# Patient Record
Sex: Female | Born: 1940 | Race: Asian | Hispanic: No | Marital: Married | Smoking: Never smoker
Health system: Southern US, Community
[De-identification: ages and names within clinical notes are randomized; demographics above are authoritative.]

## PROBLEM LIST (undated history)

## (undated) DIAGNOSIS — E785 Hyperlipidemia, unspecified: Secondary | ICD-10-CM

## (undated) DIAGNOSIS — M810 Age-related osteoporosis without current pathological fracture: Secondary | ICD-10-CM

## (undated) HISTORY — DX: Hyperlipidemia, unspecified: E78.5

## (undated) HISTORY — DX: Age-related osteoporosis without current pathological fracture: M81.0

---

## 1998-07-07 ENCOUNTER — Other Ambulatory Visit: Admission: RE | Admit: 1998-07-07 | Discharge: 1998-07-07 | Payer: Self-pay | Admitting: Family Medicine

## 1999-07-19 ENCOUNTER — Other Ambulatory Visit: Admission: RE | Admit: 1999-07-19 | Discharge: 1999-07-19 | Payer: Self-pay | Admitting: Family Medicine

## 2003-12-24 ENCOUNTER — Other Ambulatory Visit: Admission: RE | Admit: 2003-12-24 | Discharge: 2003-12-24 | Payer: Self-pay | Admitting: Family Medicine

## 2004-03-24 ENCOUNTER — Ambulatory Visit: Payer: Self-pay | Admitting: Family Medicine

## 2004-03-31 ENCOUNTER — Ambulatory Visit: Payer: Self-pay | Admitting: Family Medicine

## 2004-12-29 ENCOUNTER — Ambulatory Visit: Payer: Self-pay | Admitting: Family Medicine

## 2005-01-05 ENCOUNTER — Ambulatory Visit: Payer: Self-pay | Admitting: Family Medicine

## 2005-12-21 ENCOUNTER — Ambulatory Visit: Payer: Self-pay | Admitting: Family Medicine

## 2005-12-28 ENCOUNTER — Other Ambulatory Visit: Admission: RE | Admit: 2005-12-28 | Discharge: 2005-12-28 | Payer: Self-pay | Admitting: Family Medicine

## 2005-12-28 ENCOUNTER — Ambulatory Visit: Payer: Self-pay | Admitting: Family Medicine

## 2006-01-27 ENCOUNTER — Ambulatory Visit: Payer: Self-pay | Admitting: Family Medicine

## 2006-02-21 ENCOUNTER — Ambulatory Visit: Payer: Self-pay | Admitting: Family Medicine

## 2006-12-27 ENCOUNTER — Encounter: Payer: Self-pay | Admitting: Family Medicine

## 2007-01-03 ENCOUNTER — Ambulatory Visit: Payer: Self-pay | Admitting: Family Medicine

## 2007-01-03 LAB — CONVERTED CEMR LAB
Bilirubin Urine: NEGATIVE
Ketones, urine, test strip: NEGATIVE
Specific Gravity, Urine: 1.02
Urobilinogen, UA: 0.2
pH: 6.5

## 2007-01-09 ENCOUNTER — Ambulatory Visit: Payer: Self-pay | Admitting: Family Medicine

## 2007-01-09 DIAGNOSIS — M19049 Primary osteoarthritis, unspecified hand: Secondary | ICD-10-CM | POA: Insufficient documentation

## 2007-01-09 DIAGNOSIS — L259 Unspecified contact dermatitis, unspecified cause: Secondary | ICD-10-CM | POA: Insufficient documentation

## 2007-01-09 LAB — CONVERTED CEMR LAB
Albumin: 4.2 g/dL (ref 3.5–5.2)
Alkaline Phosphatase: 52 units/L (ref 39–117)
BUN: 13 mg/dL (ref 6–23)
Basophils Absolute: 0 10*3/uL (ref 0.0–0.1)
Cholesterol: 151 mg/dL (ref 0–200)
GFR calc Af Amer: 93 mL/min
HDL: 65.6 mg/dL (ref >39.0)
Hemoglobin: 14.4 g/dL (ref 12.0–15.0)
Lymphocytes Relative: 41 % (ref 12.0–46.0)
MCHC: 34.4 g/dL (ref 30.0–36.0)
MCV: 94.1 fL (ref 78.0–100.0)
Monocytes Absolute: 0.6 10*3/uL (ref 0.2–0.7)
Monocytes Relative: 9 % (ref 3.0–11.0)
Neutro Abs: 2.8 10*3/uL (ref 1.4–7.7)
Potassium: 4.6 meq/L (ref 3.5–5.1)
Sodium: 146 meq/L — ABNORMAL HIGH (ref 135–145)
TSH: 1.11 microintl units/mL (ref 0.35–5.50)
Total Protein: 7.2 g/dL (ref 6.0–8.3)
Vit D, 1,25-Dihydroxy: 27 (ref 20–57)

## 2007-01-17 ENCOUNTER — Encounter: Payer: Self-pay | Admitting: Family Medicine

## 2007-01-24 ENCOUNTER — Ambulatory Visit: Payer: Self-pay | Admitting: Family Medicine

## 2007-01-25 ENCOUNTER — Encounter: Payer: Self-pay | Admitting: Family Medicine

## 2007-01-31 ENCOUNTER — Telehealth: Payer: Self-pay | Admitting: Family Medicine

## 2007-03-14 ENCOUNTER — Encounter: Payer: Self-pay | Admitting: Family Medicine

## 2007-12-18 ENCOUNTER — Ambulatory Visit: Payer: Self-pay | Admitting: Gastroenterology

## 2007-12-28 ENCOUNTER — Encounter: Payer: Self-pay | Admitting: Family Medicine

## 2007-12-31 ENCOUNTER — Ambulatory Visit: Payer: Self-pay | Admitting: Gastroenterology

## 2007-12-31 LAB — HM COLONOSCOPY

## 2008-01-08 ENCOUNTER — Ambulatory Visit: Payer: Self-pay | Admitting: Family Medicine

## 2008-01-08 LAB — CONVERTED CEMR LAB
Blood in Urine, dipstick: NEGATIVE
Glucose, Urine, Semiquant: NEGATIVE
Specific Gravity, Urine: 1.015
WBC Urine, dipstick: NEGATIVE
pH: 6

## 2008-01-15 ENCOUNTER — Other Ambulatory Visit: Admission: RE | Admit: 2008-01-15 | Discharge: 2008-01-15 | Payer: Self-pay | Admitting: Family Medicine

## 2008-01-15 ENCOUNTER — Encounter: Payer: Self-pay | Admitting: Family Medicine

## 2008-01-15 ENCOUNTER — Ambulatory Visit: Payer: Self-pay | Admitting: Family Medicine

## 2008-01-15 DIAGNOSIS — G4700 Insomnia, unspecified: Secondary | ICD-10-CM

## 2008-01-15 DIAGNOSIS — E785 Hyperlipidemia, unspecified: Secondary | ICD-10-CM

## 2008-01-15 DIAGNOSIS — M899 Disorder of bone, unspecified: Secondary | ICD-10-CM | POA: Insufficient documentation

## 2008-01-15 DIAGNOSIS — M949 Disorder of cartilage, unspecified: Secondary | ICD-10-CM

## 2008-01-15 LAB — CONVERTED CEMR LAB
Albumin: 4.1 g/dL (ref 3.5–5.2)
BUN: 15 mg/dL (ref 6–23)
Basophils Relative: 0.2 % (ref 0.0–3.0)
Calcium: 9.3 mg/dL (ref 8.4–10.5)
Creatinine, Ser: 0.8 mg/dL (ref 0.4–1.2)
Eosinophils Absolute: 0.2 10*3/uL (ref 0.0–0.7)
Eosinophils Relative: 3.7 % (ref 0.0–5.0)
GFR calc Af Amer: 92 mL/min
GFR calc non Af Amer: 76 mL/min
Glucose, Bld: 106 mg/dL — ABNORMAL HIGH (ref 70–99)
HCT: 40.5 % (ref 36.0–46.0)
HDL: 57.3 mg/dL (ref >39.0)
Hemoglobin: 14.5 g/dL (ref 12.0–15.0)
MCV: 94 fL (ref 78.0–100.0)
Monocytes Absolute: 0.4 10*3/uL (ref 0.1–1.0)
Monocytes Relative: 6.5 % (ref 3.0–12.0)
Neutro Abs: 3.5 10*3/uL (ref 1.4–7.7)
Platelets: 164 10*3/uL (ref 150–400)
RBC: 4.31 M/uL (ref 3.87–5.11)
TSH: 1.04 microintl units/mL (ref 0.35–5.50)
Total Protein: 7 g/dL (ref 6.0–8.3)
WBC: 6.6 10*3/uL (ref 4.5–10.5)

## 2008-01-22 ENCOUNTER — Encounter: Payer: Self-pay | Admitting: Family Medicine

## 2009-01-05 ENCOUNTER — Encounter: Payer: Self-pay | Admitting: Family Medicine

## 2009-01-12 ENCOUNTER — Telehealth: Payer: Self-pay | Admitting: Family Medicine

## 2009-01-12 DIAGNOSIS — R109 Unspecified abdominal pain: Secondary | ICD-10-CM | POA: Insufficient documentation

## 2009-01-21 ENCOUNTER — Ambulatory Visit: Payer: Self-pay | Admitting: Family Medicine

## 2009-01-21 LAB — CONVERTED CEMR LAB
Bilirubin Urine: NEGATIVE
Glucose, Urine, Semiquant: NEGATIVE
Protein, U semiquant: NEGATIVE
Specific Gravity, Urine: 1.02
WBC Urine, dipstick: NEGATIVE

## 2009-01-23 ENCOUNTER — Encounter: Payer: Self-pay | Admitting: Family Medicine

## 2009-01-28 ENCOUNTER — Ambulatory Visit: Payer: Self-pay | Admitting: Family Medicine

## 2009-01-28 DIAGNOSIS — I868 Varicose veins of other specified sites: Secondary | ICD-10-CM

## 2009-02-03 LAB — CONVERTED CEMR LAB
Alkaline Phosphatase: 52 units/L (ref 39–117)
BUN: 11 mg/dL (ref 6–23)
Basophils Absolute: 0 10*3/uL (ref 0.0–0.1)
Bilirubin, Direct: 0.1 mg/dL (ref 0.0–0.3)
Chloride: 103 meq/L (ref 96–112)
Cholesterol: 137 mg/dL (ref 0–200)
Eosinophils Absolute: 0.1 10*3/uL (ref 0.0–0.7)
GFR calc non Af Amer: 88.69 mL/min (ref >60)
HCT: 42.2 % (ref 36.0–46.0)
Hemoglobin: 14.3 g/dL (ref 12.0–15.0)
LDL Cholesterol: 68 mg/dL (ref 0–99)
Lymphocytes Relative: 36 % (ref 12.0–46.0)
MCHC: 33.9 g/dL (ref 30.0–36.0)
MCV: 96.2 fL (ref 78.0–100.0)
Neutrophils Relative %: 54.1 % (ref 43.0–77.0)
Platelets: 150 10*3/uL (ref 150.0–400.0)
Potassium: 4.5 meq/L (ref 3.5–5.1)
RDW: 12.1 % (ref 11.5–14.6)
Sodium: 142 meq/L (ref 135–145)
Total Bilirubin: 1.2 mg/dL (ref 0.3–1.2)
VLDL: 11.6 mg/dL (ref 0.0–40.0)
WBC: 5.4 10*3/uL (ref 4.5–10.5)

## 2009-02-19 ENCOUNTER — Ambulatory Visit (HOSPITAL_COMMUNITY): Admission: RE | Admit: 2009-02-19 | Discharge: 2009-02-19 | Payer: Self-pay | Admitting: Gastroenterology

## 2009-02-19 ENCOUNTER — Encounter (INDEPENDENT_AMBULATORY_CARE_PROVIDER_SITE_OTHER): Payer: Self-pay | Admitting: Gastroenterology

## 2011-04-19 DIAGNOSIS — G2581 Restless legs syndrome: Secondary | ICD-10-CM | POA: Diagnosis not present

## 2011-04-19 DIAGNOSIS — G4700 Insomnia, unspecified: Secondary | ICD-10-CM | POA: Diagnosis not present

## 2011-04-19 DIAGNOSIS — D518 Other vitamin B12 deficiency anemias: Secondary | ICD-10-CM | POA: Diagnosis not present

## 2011-04-25 DIAGNOSIS — H5210 Myopia, unspecified eye: Secondary | ICD-10-CM | POA: Diagnosis not present

## 2011-04-25 DIAGNOSIS — H35369 Drusen (degenerative) of macula, unspecified eye: Secondary | ICD-10-CM | POA: Diagnosis not present

## 2011-04-25 DIAGNOSIS — H4011X0 Primary open-angle glaucoma, stage unspecified: Secondary | ICD-10-CM | POA: Diagnosis not present

## 2011-04-25 DIAGNOSIS — H524 Presbyopia: Secondary | ICD-10-CM | POA: Diagnosis not present

## 2011-05-19 DIAGNOSIS — L259 Unspecified contact dermatitis, unspecified cause: Secondary | ICD-10-CM | POA: Diagnosis not present

## 2011-06-30 DIAGNOSIS — L259 Unspecified contact dermatitis, unspecified cause: Secondary | ICD-10-CM | POA: Diagnosis not present

## 2011-07-26 DIAGNOSIS — H40009 Preglaucoma, unspecified, unspecified eye: Secondary | ICD-10-CM | POA: Diagnosis not present

## 2011-09-09 DIAGNOSIS — G2581 Restless legs syndrome: Secondary | ICD-10-CM | POA: Diagnosis not present

## 2011-09-09 DIAGNOSIS — G4700 Insomnia, unspecified: Secondary | ICD-10-CM | POA: Diagnosis not present

## 2011-12-31 ENCOUNTER — Ambulatory Visit (INDEPENDENT_AMBULATORY_CARE_PROVIDER_SITE_OTHER): Payer: Medicare Other | Admitting: Family Medicine

## 2011-12-31 VITALS — BP 120/82 | HR 86 | Temp 98.5°F | Resp 16 | Ht 59.5 in | Wt 111.0 lb

## 2011-12-31 DIAGNOSIS — R059 Cough, unspecified: Secondary | ICD-10-CM | POA: Diagnosis not present

## 2011-12-31 DIAGNOSIS — J029 Acute pharyngitis, unspecified: Secondary | ICD-10-CM

## 2011-12-31 DIAGNOSIS — R05 Cough: Secondary | ICD-10-CM | POA: Diagnosis not present

## 2011-12-31 DIAGNOSIS — R070 Pain in throat: Secondary | ICD-10-CM

## 2011-12-31 MED ORDER — HYDROCODONE-HOMATROPINE 5-1.5 MG/5ML PO SYRP: 5.0000 mL | ORAL_SOLUTION | Freq: Three times a day (TID) | ORAL | Status: DC | PRN

## 2011-12-31 MED ORDER — AZITHROMYCIN 250 MG PO TABS: ORAL_TABLET | ORAL | Status: DC

## 2011-12-31 NOTE — Patient Instructions (Signed)

## 2011-12-31 NOTE — Progress Notes (Signed)
@[email or web address] @   Patient ID: Kelsey Brown MRN: [id number], DOB: December 09, 1940, 71 y.o. Date of Encounter: 12/31/2011, 11:08 AM  Primary Physician: Tally Due, MD  Chief Complaint:  Chief Complaint  Patient presents with  . Cough  . Nasal Congestion    HPI: 71 y.o. year old female presents with day history of sore throat. Subjective fever and chills. No cough, congestion, rhinorrhea, sinus pressure, otalgia, or headache. Normal hearing. No GI complaints. Able to swallow saliva, but hurts to do so. Decreased appetite secondary to sore throat.   No past medical history on file.   Home Meds: Prior to Admission medications   Medication Sig Start Date End Date Taking? Authorizing Provider  atorvastatin (LIPITOR) 20 MG tablet Take 20 mg by mouth daily.   Yes Historical Provider, MD  raloxifene (EVISTA) 60 MG tablet Take 60 mg by mouth daily.   Yes Historical Provider, MD  azithromycin (ZITHROMAX Z-PAK) 250 MG tablet Take as directed on pack 12/31/11   Elvina Sidle, MD  HYDROcodone-homatropine [facility]) 5-1.5 MG/5ML syrup Take 5 mLs by mouth every 8 (eight) hours as needed for cough. 12/31/11   Elvina Sidle, MD    Allergies: No Known Allergies  History   Social History  . Marital Status: Married    Spouse Name: N/A    Number of Children: N/A  . Years of Education: N/A   Occupational History  . Not on file.   Social History Main Topics  . Smoking status: Never Smoker   . Smokeless tobacco: Not on file  . Alcohol Use: Not on file  . Drug Use: Not on file  . Sexually Active: Not on file   Other Topics Concern  . Not on file   Social History Narrative  . No narrative on file     Review of Systems: Constitutional: negative for chills, fever, night sweats or weight changes HEENT: see above Cardiovascular: negative for chest pain or palpitations Respiratory: negative for hemoptysis, wheezing, or shortness of breath Abdominal: negative for abdominal pain, nausea,  vomiting or diarrhea Dermatological: negative for rash Neurologic: negative for headache   Physical Exam: Blood pressure 120/82, pulse 86, temperature 98.5 F (36.9 C), temperature source Oral, resp. rate 16, height 4' 11.5" (1.511 m), weight 111 lb (50.349 kg), SpO2 99.00%., Body mass index is 22.04 kg/(m^2). General: Well developed, well nourished, in no acute distress. Head: Normocephalic, atraumatic, eyes without discharge, sclera non-icteric, nares are patent. Bilateral auditory canals clear, TM's are without perforation, pearly grey with reflective cone of light bilaterally. No sinus TTP. Oral cavity moist, dentition normal. Posterior pharynx with post nasal drip and mild erythema. No peritonsillar abscess or tonsillar exudate. Neck: Supple. No thyromegaly. Full ROM. No lymphadenopathy. Lungs: Clear bilaterally to auscultation without wheezes, rales, or rhonchi. Breathing is unlabored. Heart: RRR with S1 S2. No murmurs, rubs, or gallops appreciated. Abdomen: Soft, non-tender, non-distended with normoactive bowel sounds. No hepatomegaly. No rebound/guarding. No obvious abdominal masses. Msk:  Strength and tone normal for age. Extremities: No clubbing or cyanosis. No edema. Neuro: Alert and oriented X 3. Moves all extremities spontaneously. CNII-XII grossly in tact. Psych:  Responds to questions appropriately with a normal affect.   Labs:   ASSESSMENT AND PLAN:  71 y.o. year old female with  - 1. Sore throat  Culture, Group A Strep, azithromycin (ZITHROMAX Z-PAK) 250 MG tablet  2. Cough  azithromycin (ZITHROMAX Z-PAK) 250 MG tablet, HYDROcodone-homatropine (HYCODAN) 5-1.5 MG/5ML syrup    -Tylenol/Motrin prn -Rest/fluids -RTC precautions -RTC  3-5 days if no improvement  Signed, Elvina Sidle, MD 12/31/2011 11:08 AM

## 2012-01-02 LAB — CULTURE, GROUP A STREP: Organism ID, Bacteria: NORMAL

## 2012-01-23 DIAGNOSIS — M899 Disorder of bone, unspecified: Secondary | ICD-10-CM | POA: Diagnosis not present

## 2012-01-23 DIAGNOSIS — M949 Disorder of cartilage, unspecified: Secondary | ICD-10-CM | POA: Diagnosis not present

## 2012-01-23 DIAGNOSIS — Z1231 Encounter for screening mammogram for malignant neoplasm of breast: Secondary | ICD-10-CM | POA: Diagnosis not present

## 2012-01-27 DIAGNOSIS — M899 Disorder of bone, unspecified: Secondary | ICD-10-CM | POA: Diagnosis not present

## 2012-01-27 DIAGNOSIS — G2581 Restless legs syndrome: Secondary | ICD-10-CM | POA: Diagnosis not present

## 2012-01-27 DIAGNOSIS — Z23 Encounter for immunization: Secondary | ICD-10-CM | POA: Diagnosis not present

## 2012-01-27 DIAGNOSIS — M949 Disorder of cartilage, unspecified: Secondary | ICD-10-CM | POA: Diagnosis not present

## 2012-01-27 DIAGNOSIS — G4700 Insomnia, unspecified: Secondary | ICD-10-CM | POA: Diagnosis not present

## 2012-01-27 DIAGNOSIS — Z0000 Encounter for general adult medical examination without abnormal findings: Secondary | ICD-10-CM | POA: Diagnosis not present

## 2012-01-27 DIAGNOSIS — E7800 Pure hypercholesterolemia, unspecified: Secondary | ICD-10-CM | POA: Diagnosis not present

## 2012-02-02 DIAGNOSIS — H40009 Preglaucoma, unspecified, unspecified eye: Secondary | ICD-10-CM | POA: Diagnosis not present

## 2012-02-15 DIAGNOSIS — Z01419 Encounter for gynecological examination (general) (routine) without abnormal findings: Secondary | ICD-10-CM | POA: Diagnosis not present

## 2012-02-15 DIAGNOSIS — Z124 Encounter for screening for malignant neoplasm of cervix: Secondary | ICD-10-CM | POA: Diagnosis not present

## 2012-05-03 DIAGNOSIS — H40009 Preglaucoma, unspecified, unspecified eye: Secondary | ICD-10-CM | POA: Diagnosis not present

## 2012-05-03 DIAGNOSIS — H2510 Age-related nuclear cataract, unspecified eye: Secondary | ICD-10-CM | POA: Diagnosis not present

## 2012-08-13 DIAGNOSIS — H40009 Preglaucoma, unspecified, unspecified eye: Secondary | ICD-10-CM | POA: Diagnosis not present

## 2013-01-29 DIAGNOSIS — Z1231 Encounter for screening mammogram for malignant neoplasm of breast: Secondary | ICD-10-CM | POA: Diagnosis not present

## 2013-02-13 DIAGNOSIS — H40009 Preglaucoma, unspecified, unspecified eye: Secondary | ICD-10-CM | POA: Diagnosis not present

## 2013-02-18 DIAGNOSIS — M899 Disorder of bone, unspecified: Secondary | ICD-10-CM | POA: Diagnosis not present

## 2013-02-18 DIAGNOSIS — E7800 Pure hypercholesterolemia, unspecified: Secondary | ICD-10-CM | POA: Diagnosis not present

## 2013-02-18 DIAGNOSIS — G2581 Restless legs syndrome: Secondary | ICD-10-CM | POA: Diagnosis not present

## 2013-02-18 DIAGNOSIS — Z0000 Encounter for general adult medical examination without abnormal findings: Secondary | ICD-10-CM | POA: Diagnosis not present

## 2013-02-26 DIAGNOSIS — Z01419 Encounter for gynecological examination (general) (routine) without abnormal findings: Secondary | ICD-10-CM | POA: Diagnosis not present

## 2013-02-26 DIAGNOSIS — Z124 Encounter for screening for malignant neoplasm of cervix: Secondary | ICD-10-CM | POA: Diagnosis not present

## 2013-04-25 DIAGNOSIS — H2510 Age-related nuclear cataract, unspecified eye: Secondary | ICD-10-CM | POA: Diagnosis not present

## 2013-04-25 DIAGNOSIS — H40009 Preglaucoma, unspecified, unspecified eye: Secondary | ICD-10-CM | POA: Diagnosis not present

## 2013-06-11 ENCOUNTER — Ambulatory Visit (INDEPENDENT_AMBULATORY_CARE_PROVIDER_SITE_OTHER): Payer: Medicare Other | Admitting: Internal Medicine

## 2013-06-11 VITALS — BP 124/80 | HR 99 | Temp 98.0°F | Resp 18 | Ht 60.0 in | Wt 118.0 lb

## 2013-06-11 DIAGNOSIS — J209 Acute bronchitis, unspecified: Secondary | ICD-10-CM | POA: Diagnosis not present

## 2013-06-11 MED ORDER — HYDROCOD POLST-CHLORPHEN POLST 10-8 MG/5ML PO LQCR: 5.0000 mL | Freq: Two times a day (BID) | ORAL | Status: DC | PRN

## 2013-06-11 MED ORDER — AZITHROMYCIN 250 MG PO TABS: ORAL_TABLET | ORAL | Status: DC

## 2013-06-11 NOTE — Progress Notes (Signed)
   Subjective:    Patient ID: Kelsey Brown, female    DOB: 08/19/1940, 73 y.o.   MRN: [id number]  HPI 73 year old patient presents with runny nose, cough, chest congestion. Pt states the symptoms having been going on for 10 days. Pt states no fever, headache, and sinus pressure. The cough is productive with yellowish/clear phlegm. She has been taking alka-seltzer plus; says it has helped a little bit. Pt has not been around anyone that is sick.  No sob, cp.   Review of Systems neg    Objective:   Physical Exam  Constitutional: She is oriented to person, place, and time. She appears well-developed and well-nourished.  HENT:  Head: Normocephalic.  Right Ear: External ear normal.  Left Ear: External ear normal.  Nose: Mucosal edema, rhinorrhea and sinus tenderness present. No epistaxis. Right sinus exhibits no frontal sinus tenderness. Left sinus exhibits no maxillary sinus tenderness and no frontal sinus tenderness.  Eyes: Conjunctivae and EOM are normal. Pupils are equal, round, and reactive to light.  Neck: Normal range of motion.  Cardiovascular: Normal rate.   Pulmonary/Chest: Effort normal. Not tachypneic. She has no decreased breath sounds. She has no wheezes. She has rhonchi. She has no rales.  Musculoskeletal: Normal range of motion.  Neurological: She is alert and oriented to person, place, and time. She exhibits normal muscle tone. Coordination normal.  Psychiatric: She has a normal mood and affect. Her behavior is normal. Judgment and thought content normal.          Assessment & Plan:

## 2013-06-11 NOTE — Patient Instructions (Signed)

## 2013-10-23 DIAGNOSIS — H40009 Preglaucoma, unspecified, unspecified eye: Secondary | ICD-10-CM | POA: Diagnosis not present

## 2014-02-03 DIAGNOSIS — Z1231 Encounter for screening mammogram for malignant neoplasm of breast: Secondary | ICD-10-CM | POA: Diagnosis not present

## 2014-02-03 DIAGNOSIS — M8580 Other specified disorders of bone density and structure, unspecified site: Secondary | ICD-10-CM | POA: Diagnosis not present

## 2014-02-03 DIAGNOSIS — E559 Vitamin D deficiency, unspecified: Secondary | ICD-10-CM | POA: Diagnosis not present

## 2014-03-19 DIAGNOSIS — G2581 Restless legs syndrome: Secondary | ICD-10-CM | POA: Diagnosis not present

## 2014-03-19 DIAGNOSIS — Z0001 Encounter for general adult medical examination with abnormal findings: Secondary | ICD-10-CM | POA: Diagnosis not present

## 2014-03-19 DIAGNOSIS — E780 Pure hypercholesterolemia: Secondary | ICD-10-CM | POA: Diagnosis not present

## 2014-03-19 DIAGNOSIS — M8580 Other specified disorders of bone density and structure, unspecified site: Secondary | ICD-10-CM | POA: Diagnosis not present

## 2014-03-19 DIAGNOSIS — H409 Unspecified glaucoma: Secondary | ICD-10-CM | POA: Diagnosis not present

## 2014-03-19 DIAGNOSIS — Z23 Encounter for immunization: Secondary | ICD-10-CM | POA: Diagnosis not present

## 2014-04-28 DIAGNOSIS — H2513 Age-related nuclear cataract, bilateral: Secondary | ICD-10-CM | POA: Diagnosis not present

## 2014-04-28 DIAGNOSIS — H40013 Open angle with borderline findings, low risk, bilateral: Secondary | ICD-10-CM | POA: Diagnosis not present

## 2014-10-29 DIAGNOSIS — H40013 Open angle with borderline findings, low risk, bilateral: Secondary | ICD-10-CM | POA: Diagnosis not present

## 2014-10-31 ENCOUNTER — Ambulatory Visit (INDEPENDENT_AMBULATORY_CARE_PROVIDER_SITE_OTHER): Payer: Medicare Other | Admitting: Family Medicine

## 2014-10-31 ENCOUNTER — Ambulatory Visit (INDEPENDENT_AMBULATORY_CARE_PROVIDER_SITE_OTHER): Payer: Medicare Other

## 2014-10-31 VITALS — BP 142/70 | HR 110 | Temp 98.7°F | Resp 18 | Ht 59.0 in | Wt 116.0 lb

## 2014-10-31 DIAGNOSIS — M19041 Primary osteoarthritis, right hand: Secondary | ICD-10-CM

## 2014-10-31 DIAGNOSIS — M1811 Unilateral primary osteoarthritis of first carpometacarpal joint, right hand: Secondary | ICD-10-CM

## 2014-10-31 DIAGNOSIS — M79644 Pain in right finger(s): Secondary | ICD-10-CM

## 2014-10-31 MED ORDER — PREDNISONE 20 MG PO TABS: ORAL_TABLET | ORAL | Status: AC

## 2014-10-31 NOTE — Patient Instructions (Signed)
You have an acute arthritis that has, after spraying the thumb. This should get better in the next couple days with the medicine prescribed. Make sure you take the prednisone with food. He can stop using the splint after Sunday.
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
## 2015-01-07 ENCOUNTER — Telehealth: Payer: Self-pay | Admitting: Family Medicine

## 2015-01-07 NOTE — Telephone Encounter (Signed)
Tried to call the patient to set up an appointment to schedule a mammogram.

## 2015-01-28 ENCOUNTER — Telehealth: Payer: Self-pay | Admitting: Family Medicine

## 2015-01-28 ENCOUNTER — Encounter: Payer: Self-pay | Admitting: Gastroenterology

## 2015-01-28 NOTE — Telephone Encounter (Signed)
LEFT A MESSAGE FOR PATIENT TO RETURN CALL TO SCHEDULE AN APPOINTMENT FOR FALL, AND SCREENING FOR HIGH BLOOD PRESSURE.

## 2015-02-10 DIAGNOSIS — Z1231 Encounter for screening mammogram for malignant neoplasm of breast: Secondary | ICD-10-CM | POA: Diagnosis not present

## 2015-03-25 DIAGNOSIS — M8588 Other specified disorders of bone density and structure, other site: Secondary | ICD-10-CM | POA: Diagnosis not present

## 2015-03-25 DIAGNOSIS — E7800 Pure hypercholesterolemia, unspecified: Secondary | ICD-10-CM | POA: Diagnosis not present

## 2015-03-25 DIAGNOSIS — H409 Unspecified glaucoma: Secondary | ICD-10-CM | POA: Diagnosis not present

## 2015-03-25 DIAGNOSIS — Z0000 Encounter for general adult medical examination without abnormal findings: Secondary | ICD-10-CM | POA: Diagnosis not present

## 2015-03-25 DIAGNOSIS — G2581 Restless legs syndrome: Secondary | ICD-10-CM | POA: Diagnosis not present

## 2015-03-25 DIAGNOSIS — Z23 Encounter for immunization: Secondary | ICD-10-CM | POA: Diagnosis not present

## 2015-05-04 DIAGNOSIS — H2513 Age-related nuclear cataract, bilateral: Secondary | ICD-10-CM | POA: Diagnosis not present

## 2015-05-04 DIAGNOSIS — H43812 Vitreous degeneration, left eye: Secondary | ICD-10-CM | POA: Diagnosis not present

## 2015-05-04 DIAGNOSIS — H40013 Open angle with borderline findings, low risk, bilateral: Secondary | ICD-10-CM | POA: Diagnosis not present

## 2015-11-04 DIAGNOSIS — H43813 Vitreous degeneration, bilateral: Secondary | ICD-10-CM | POA: Diagnosis not present

## 2015-11-04 DIAGNOSIS — H40013 Open angle with borderline findings, low risk, bilateral: Secondary | ICD-10-CM | POA: Diagnosis not present

## 2015-11-30 DIAGNOSIS — H25813 Combined forms of age-related cataract, bilateral: Secondary | ICD-10-CM | POA: Diagnosis not present

## 2015-11-30 DIAGNOSIS — H47233 Glaucomatous optic atrophy, bilateral: Secondary | ICD-10-CM | POA: Diagnosis not present

## 2015-12-10 DIAGNOSIS — H43813 Vitreous degeneration, bilateral: Secondary | ICD-10-CM | POA: Diagnosis not present

## 2015-12-31 DIAGNOSIS — Z23 Encounter for immunization: Secondary | ICD-10-CM | POA: Diagnosis not present

## 2016-02-11 DIAGNOSIS — M8589 Other specified disorders of bone density and structure, multiple sites: Secondary | ICD-10-CM | POA: Diagnosis not present

## 2016-02-11 DIAGNOSIS — Z1231 Encounter for screening mammogram for malignant neoplasm of breast: Secondary | ICD-10-CM | POA: Diagnosis not present

## 2016-02-18 DIAGNOSIS — E7800 Pure hypercholesterolemia, unspecified: Secondary | ICD-10-CM | POA: Diagnosis not present

## 2016-02-18 DIAGNOSIS — Z1389 Encounter for screening for other disorder: Secondary | ICD-10-CM | POA: Diagnosis not present

## 2016-02-18 DIAGNOSIS — Z0000 Encounter for general adult medical examination without abnormal findings: Secondary | ICD-10-CM | POA: Diagnosis not present

## 2016-02-18 DIAGNOSIS — M8580 Other specified disorders of bone density and structure, unspecified site: Secondary | ICD-10-CM | POA: Diagnosis not present

## 2016-06-15 DIAGNOSIS — H40013 Open angle with borderline findings, low risk, bilateral: Secondary | ICD-10-CM | POA: Diagnosis not present

## 2016-06-16 DIAGNOSIS — H409 Unspecified glaucoma: Secondary | ICD-10-CM | POA: Diagnosis not present

## 2016-06-16 DIAGNOSIS — M8580 Other specified disorders of bone density and structure, unspecified site: Secondary | ICD-10-CM | POA: Diagnosis not present

## 2016-06-16 DIAGNOSIS — E7800 Pure hypercholesterolemia, unspecified: Secondary | ICD-10-CM | POA: Diagnosis not present

## 2016-06-16 DIAGNOSIS — Z0000 Encounter for general adult medical examination without abnormal findings: Secondary | ICD-10-CM | POA: Diagnosis not present

## 2016-06-16 DIAGNOSIS — G2581 Restless legs syndrome: Secondary | ICD-10-CM | POA: Diagnosis not present

## 2016-07-13 DIAGNOSIS — H40013 Open angle with borderline findings, low risk, bilateral: Secondary | ICD-10-CM | POA: Diagnosis not present

## 2016-07-13 DIAGNOSIS — H43813 Vitreous degeneration, bilateral: Secondary | ICD-10-CM | POA: Diagnosis not present

## 2016-07-13 DIAGNOSIS — H2513 Age-related nuclear cataract, bilateral: Secondary | ICD-10-CM | POA: Diagnosis not present

## 2016-12-05 DIAGNOSIS — H47233 Glaucomatous optic atrophy, bilateral: Secondary | ICD-10-CM | POA: Diagnosis not present

## 2016-12-05 DIAGNOSIS — H02831 Dermatochalasis of right upper eyelid: Secondary | ICD-10-CM | POA: Diagnosis not present

## 2016-12-05 DIAGNOSIS — H25813 Combined forms of age-related cataract, bilateral: Secondary | ICD-10-CM | POA: Diagnosis not present

## 2017-01-25 IMAGING — CR DG FINGER THUMB 2+V*R*
3 series · 3 of 3 positions shown · non-contrast
Comparison: None.

CLINICAL DATA: Right hand injury yesterday.

EXAM:
RIGHT THUMB 2+V

[PA]
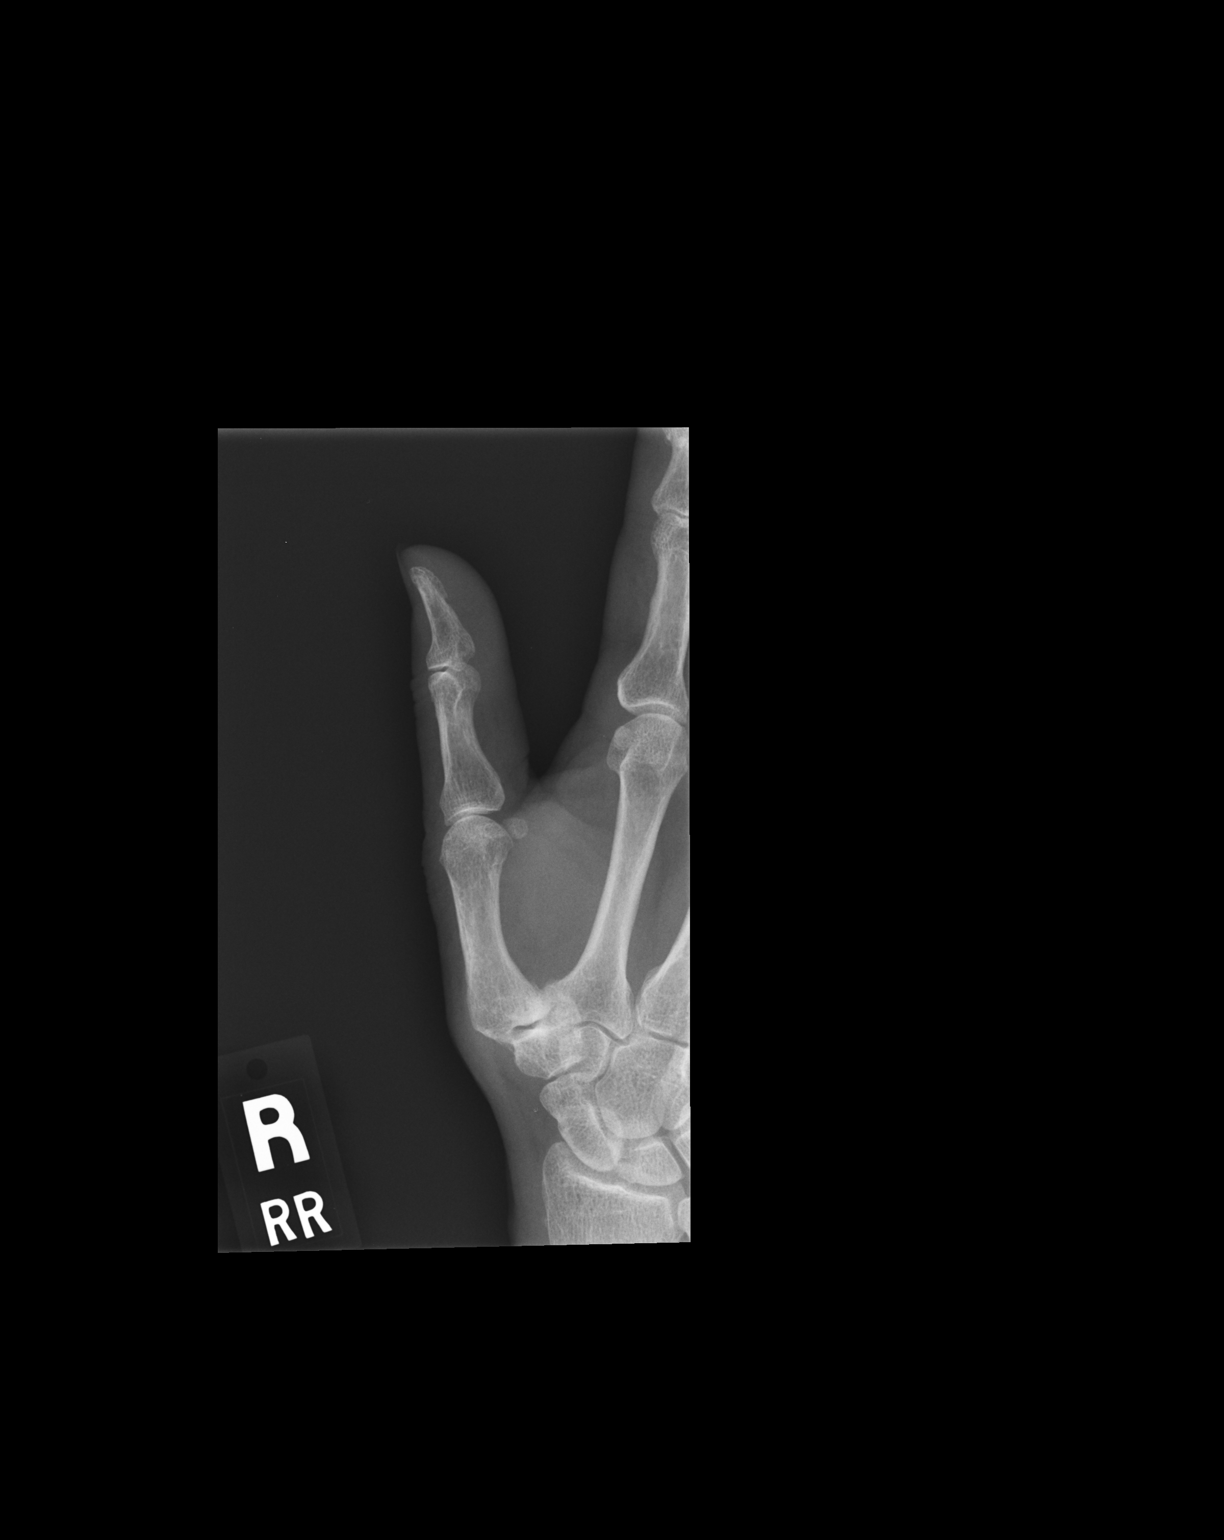

[pa obl]
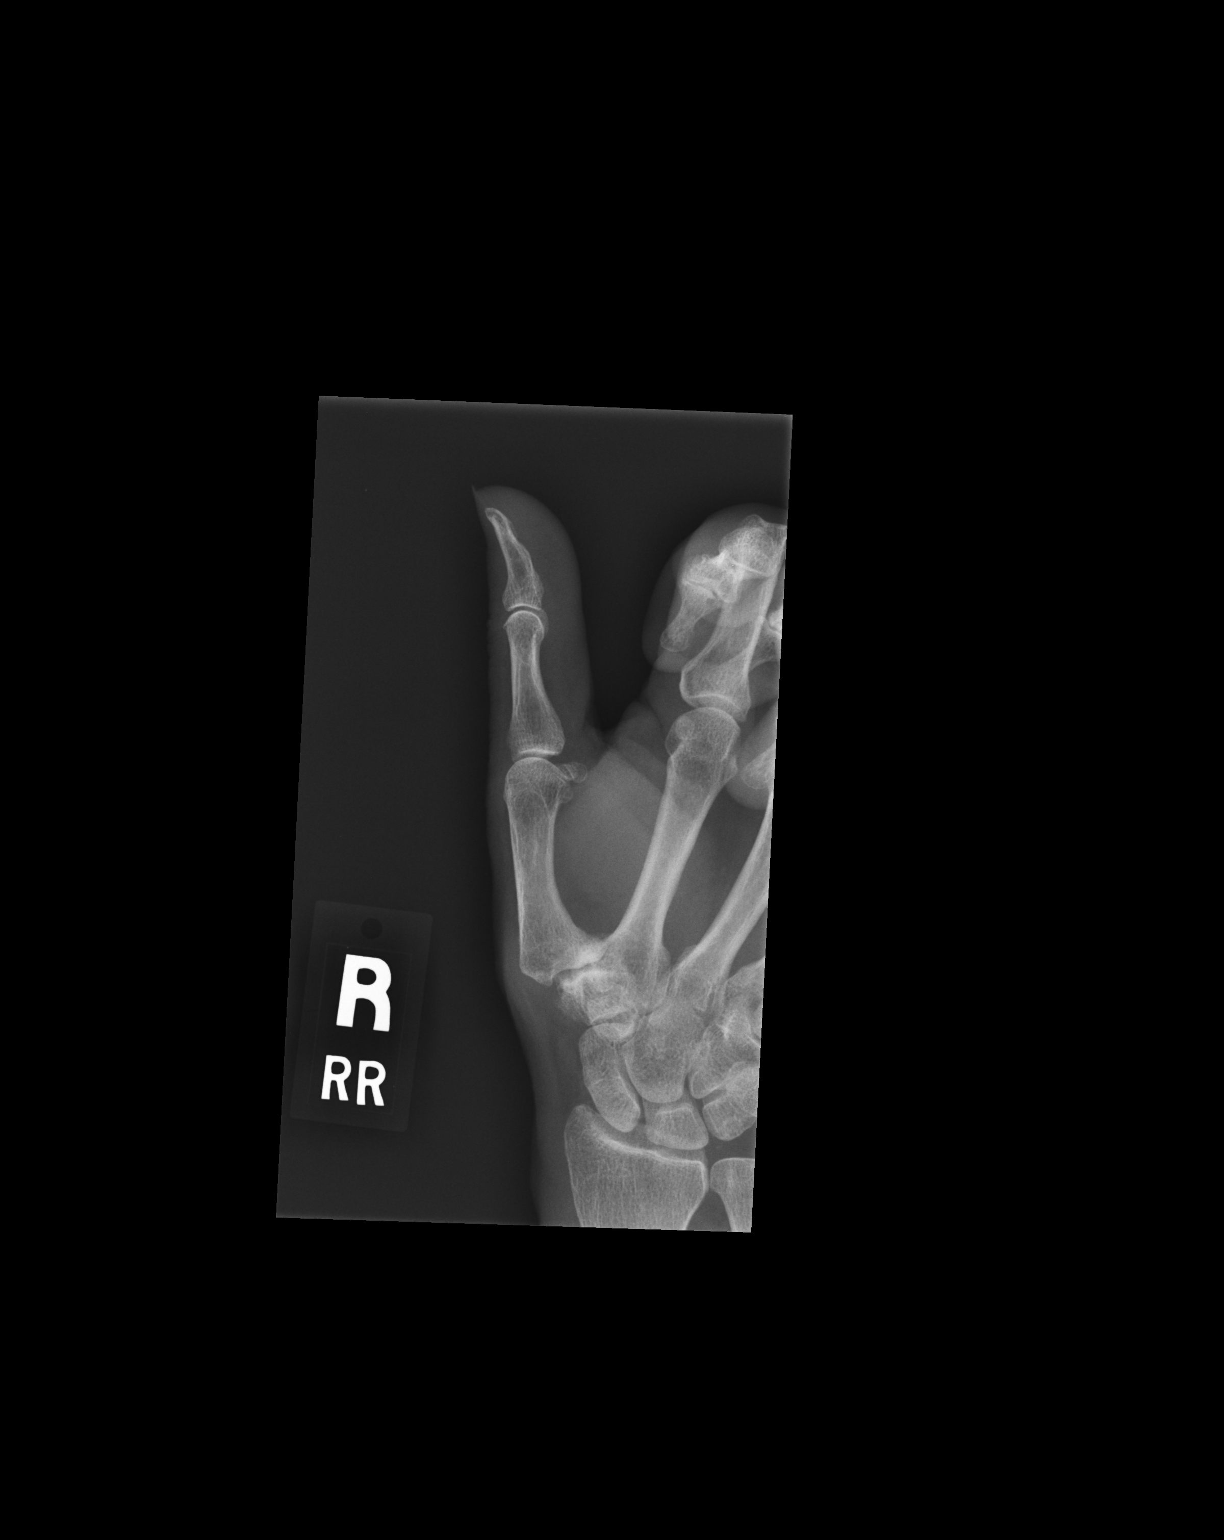

[lateral]
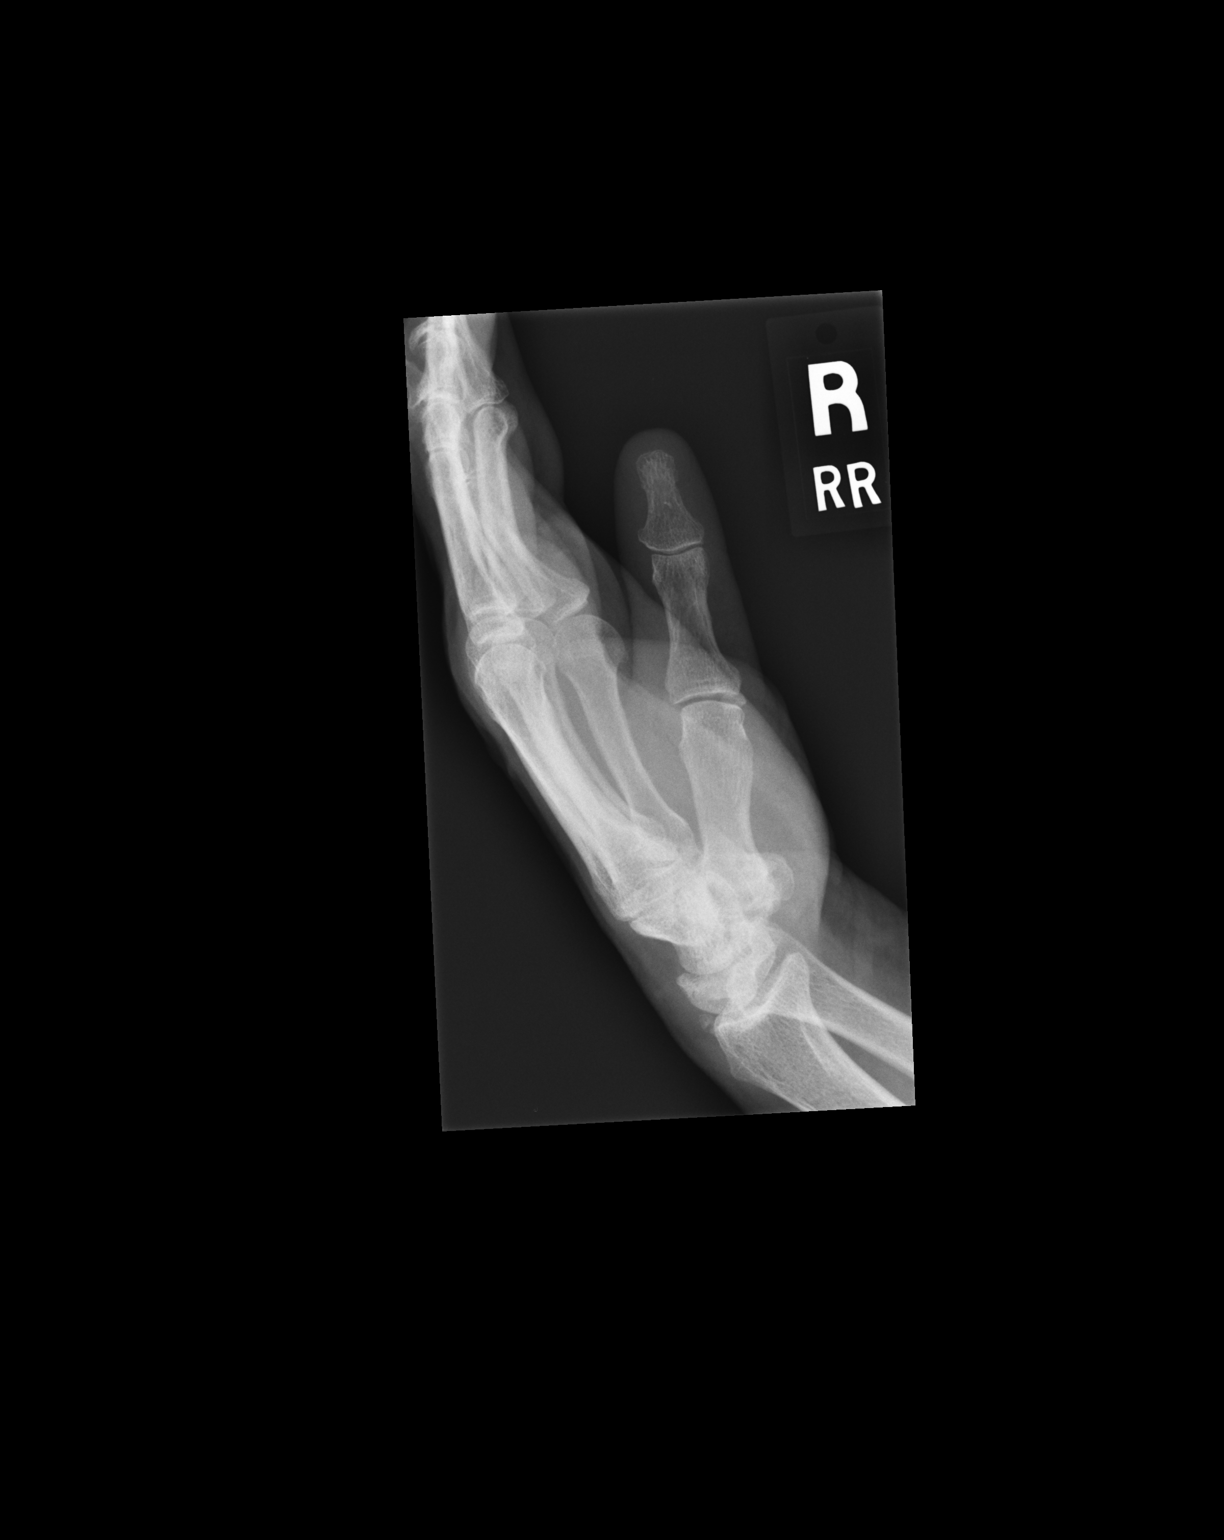

[3 of 3 positions shown; findings below may reference images not displayed]

FINDINGS: Degenerative changes are seen at the right first carpometacarpal
joint space, moderate in degree, with associated joint space
narrowing and articular surface sclerosis, and with associated mild
radial subluxation of the first metacarpal bone.

There is no fracture line or displaced fracture fragment identified.
No acute- appearing displacement. Soft tissues are unremarkable.
IMPRESSION: No acute fracture or dislocation.

Degenerative changes at the right first carpometacarpal joint space,
as detailed above.

## 2017-02-13 DIAGNOSIS — Z1231 Encounter for screening mammogram for malignant neoplasm of breast: Secondary | ICD-10-CM | POA: Diagnosis not present

## 2017-03-25 DIAGNOSIS — Z23 Encounter for immunization: Secondary | ICD-10-CM | POA: Diagnosis not present

## 2017-06-21 DIAGNOSIS — Z7721 Contact with and (suspected) exposure to potentially hazardous body fluids: Secondary | ICD-10-CM | POA: Diagnosis not present

## 2017-07-19 DIAGNOSIS — H2513 Age-related nuclear cataract, bilateral: Secondary | ICD-10-CM | POA: Diagnosis not present

## 2017-07-19 DIAGNOSIS — H43813 Vitreous degeneration, bilateral: Secondary | ICD-10-CM | POA: Diagnosis not present

## 2017-07-19 DIAGNOSIS — H40013 Open angle with borderline findings, low risk, bilateral: Secondary | ICD-10-CM | POA: Diagnosis not present

## 2017-11-06 DIAGNOSIS — Z79899 Other long term (current) drug therapy: Secondary | ICD-10-CM | POA: Diagnosis not present

## 2017-11-06 DIAGNOSIS — Z1211 Encounter for screening for malignant neoplasm of colon: Secondary | ICD-10-CM | POA: Diagnosis not present

## 2017-11-06 DIAGNOSIS — G2581 Restless legs syndrome: Secondary | ICD-10-CM | POA: Diagnosis not present

## 2017-11-06 DIAGNOSIS — G4700 Insomnia, unspecified: Secondary | ICD-10-CM | POA: Diagnosis not present

## 2017-11-06 DIAGNOSIS — Z23 Encounter for immunization: Secondary | ICD-10-CM | POA: Diagnosis not present

## 2017-11-06 DIAGNOSIS — M19041 Primary osteoarthritis, right hand: Secondary | ICD-10-CM | POA: Diagnosis not present

## 2017-11-06 DIAGNOSIS — Z0000 Encounter for general adult medical examination without abnormal findings: Secondary | ICD-10-CM | POA: Diagnosis not present

## 2017-11-06 DIAGNOSIS — Z1389 Encounter for screening for other disorder: Secondary | ICD-10-CM | POA: Diagnosis not present

## 2017-11-06 DIAGNOSIS — E7800 Pure hypercholesterolemia, unspecified: Secondary | ICD-10-CM | POA: Diagnosis not present

## 2017-11-06 DIAGNOSIS — M8580 Other specified disorders of bone density and structure, unspecified site: Secondary | ICD-10-CM | POA: Diagnosis not present

## 2017-12-19 DIAGNOSIS — H40003 Preglaucoma, unspecified, bilateral: Secondary | ICD-10-CM | POA: Diagnosis not present

## 2017-12-31 ENCOUNTER — Encounter (HOSPITAL_COMMUNITY): Payer: Self-pay | Admitting: Emergency Medicine

## 2017-12-31 ENCOUNTER — Emergency Department (HOSPITAL_COMMUNITY)
Admission: EM | Admit: 2017-12-31 | Discharge: 2017-12-31 | Disposition: A | Discharge status: Home / Self Care | Payer: Medicare Other | Attending: Emergency Medicine | Admitting: Emergency Medicine

## 2017-12-31 ENCOUNTER — Other Ambulatory Visit: Payer: Self-pay

## 2017-12-31 DIAGNOSIS — Z5321 Procedure and treatment not carried out due to patient leaving prior to being seen by health care provider: Secondary | ICD-10-CM | POA: Insufficient documentation

## 2017-12-31 DIAGNOSIS — R42 Dizziness and giddiness: Secondary | ICD-10-CM | POA: Diagnosis present

## 2017-12-31 NOTE — ED Notes (Signed)
Pt gave stickers to registration and is leaving

## 2017-12-31 NOTE — ED Triage Notes (Signed)
Pt with dizziness and vomiting since this morning. Pt denies fever.

## 2018-01-01 DIAGNOSIS — H8309 Labyrinthitis, unspecified ear: Secondary | ICD-10-CM | POA: Diagnosis not present

## 2018-01-01 DIAGNOSIS — B349 Viral infection, unspecified: Secondary | ICD-10-CM | POA: Diagnosis not present

## 2018-01-15 DIAGNOSIS — Z23 Encounter for immunization: Secondary | ICD-10-CM | POA: Diagnosis not present

## 2018-02-15 DIAGNOSIS — M8589 Other specified disorders of bone density and structure, multiple sites: Secondary | ICD-10-CM | POA: Diagnosis not present

## 2018-02-15 DIAGNOSIS — Z1231 Encounter for screening mammogram for malignant neoplasm of breast: Secondary | ICD-10-CM | POA: Diagnosis not present

## 2018-02-21 DIAGNOSIS — D123 Benign neoplasm of transverse colon: Secondary | ICD-10-CM | POA: Diagnosis not present

## 2018-02-21 DIAGNOSIS — Z1211 Encounter for screening for malignant neoplasm of colon: Secondary | ICD-10-CM | POA: Diagnosis not present

## 2018-02-21 DIAGNOSIS — D122 Benign neoplasm of ascending colon: Secondary | ICD-10-CM | POA: Diagnosis not present

## 2018-02-23 DIAGNOSIS — D123 Benign neoplasm of transverse colon: Secondary | ICD-10-CM | POA: Diagnosis not present

## 2018-02-23 DIAGNOSIS — D122 Benign neoplasm of ascending colon: Secondary | ICD-10-CM | POA: Diagnosis not present

## 2018-02-23 DIAGNOSIS — Z1211 Encounter for screening for malignant neoplasm of colon: Secondary | ICD-10-CM | POA: Diagnosis not present

## 2018-08-22 DIAGNOSIS — H40013 Open angle with borderline findings, low risk, bilateral: Secondary | ICD-10-CM | POA: Diagnosis not present

## 2018-08-22 DIAGNOSIS — H43813 Vitreous degeneration, bilateral: Secondary | ICD-10-CM | POA: Diagnosis not present

## 2018-08-22 DIAGNOSIS — H2513 Age-related nuclear cataract, bilateral: Secondary | ICD-10-CM | POA: Diagnosis not present

## 2018-12-05 DIAGNOSIS — M549 Dorsalgia, unspecified: Secondary | ICD-10-CM | POA: Diagnosis not present

## 2018-12-05 DIAGNOSIS — E559 Vitamin D deficiency, unspecified: Secondary | ICD-10-CM | POA: Diagnosis not present

## 2018-12-05 DIAGNOSIS — Z23 Encounter for immunization: Secondary | ICD-10-CM | POA: Diagnosis not present

## 2018-12-05 DIAGNOSIS — Z5181 Encounter for therapeutic drug level monitoring: Secondary | ICD-10-CM | POA: Diagnosis not present

## 2018-12-05 DIAGNOSIS — Z0000 Encounter for general adult medical examination without abnormal findings: Secondary | ICD-10-CM | POA: Diagnosis not present

## 2018-12-05 DIAGNOSIS — Z1389 Encounter for screening for other disorder: Secondary | ICD-10-CM | POA: Diagnosis not present

## 2018-12-05 DIAGNOSIS — Z1211 Encounter for screening for malignant neoplasm of colon: Secondary | ICD-10-CM | POA: Diagnosis not present

## 2018-12-05 DIAGNOSIS — M8580 Other specified disorders of bone density and structure, unspecified site: Secondary | ICD-10-CM | POA: Diagnosis not present

## 2018-12-05 DIAGNOSIS — G2581 Restless legs syndrome: Secondary | ICD-10-CM | POA: Diagnosis not present

## 2018-12-05 DIAGNOSIS — Z79899 Other long term (current) drug therapy: Secondary | ICD-10-CM | POA: Diagnosis not present

## 2018-12-05 DIAGNOSIS — G4700 Insomnia, unspecified: Secondary | ICD-10-CM | POA: Diagnosis not present

## 2018-12-05 DIAGNOSIS — M19041 Primary osteoarthritis, right hand: Secondary | ICD-10-CM | POA: Diagnosis not present

## 2018-12-05 DIAGNOSIS — E7800 Pure hypercholesterolemia, unspecified: Secondary | ICD-10-CM | POA: Diagnosis not present

## 2018-12-05 DIAGNOSIS — E785 Hyperlipidemia, unspecified: Secondary | ICD-10-CM | POA: Diagnosis not present

## 2019-01-02 DIAGNOSIS — Z23 Encounter for immunization: Secondary | ICD-10-CM | POA: Diagnosis not present

## 2019-02-20 DIAGNOSIS — Z1231 Encounter for screening mammogram for malignant neoplasm of breast: Secondary | ICD-10-CM | POA: Diagnosis not present

## 2019-03-07 ENCOUNTER — Emergency Department (HOSPITAL_COMMUNITY)
Admission: EM | Admit: 2019-03-07 | Discharge: 2019-03-07 | Disposition: A | Discharge status: Home / Self Care | Payer: Medicare Other | Attending: Emergency Medicine | Admitting: Emergency Medicine

## 2019-03-07 ENCOUNTER — Other Ambulatory Visit: Payer: Self-pay

## 2019-03-07 ENCOUNTER — Encounter (HOSPITAL_COMMUNITY): Payer: Self-pay | Admitting: Emergency Medicine

## 2019-03-07 DIAGNOSIS — S61011A Laceration without foreign body of right thumb without damage to nail, initial encounter: Secondary | ICD-10-CM | POA: Diagnosis not present

## 2019-03-07 DIAGNOSIS — Y92000 Kitchen of unspecified non-institutional (private) residence as  the place of occurrence of the external cause: Secondary | ICD-10-CM | POA: Diagnosis not present

## 2019-03-07 DIAGNOSIS — Y999 Unspecified external cause status: Secondary | ICD-10-CM | POA: Insufficient documentation

## 2019-03-07 DIAGNOSIS — Y280XXA Contact with sharp glass, undetermined intent, initial encounter: Secondary | ICD-10-CM | POA: Diagnosis not present

## 2019-03-07 DIAGNOSIS — Y93G1 Activity, food preparation and clean up: Secondary | ICD-10-CM | POA: Insufficient documentation

## 2019-03-07 DIAGNOSIS — Z79899 Other long term (current) drug therapy: Secondary | ICD-10-CM | POA: Insufficient documentation

## 2019-03-07 MED ORDER — LIDOCAINE HCL (PF) 1 % IJ SOLN
5.0000 mL | Freq: Once | INTRAMUSCULAR | Status: AC
  Administered 2019-03-07: 23:00:00 5 mL
  Filled 2019-03-07: qty 5

## 2019-03-07 NOTE — ED Provider Notes (Signed)
[facility] EMERGENCY DEPARTMENT Provider Note   CSN: [id number] Arrival date & time: 03/07/19  2016     History   Chief Complaint Chief Complaint  Patient presents with  . Hand Laceration    HPI Kelsey Brown is a 78 y.o. female.     Patient to ED after laceration injury to right thumb caused by broken glass while washing dishes this afternoon. No other injury. No numbness or uncontrolled bleeding. Patient is up-to-date on tetanus.  The history is provided by the patient. No language interpreter was used.    Past Medical History:  Diagnosis Date  . Hyperlipidemia   . Osteoporosis     Patient Active Problem List   Diagnosis Date Noted  . 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 ABDOMINAL PAIN 01/12/2009  . HYPERLIPIDEMIA 01/15/2008  . OSTEOPENIA 01/15/2008  . INSOMNIA 01/15/2008  . ECZEMA 01/09/2007  . ARTHRITIS, HANDS, BILATERAL 01/09/2007    History reviewed. No pertinent surgical history.   OB History   No obstetric history on file.      Home Medications    Prior to Admission medications   Medication Sig Start Date End Date Taking? Authorizing Provider  atorvastatin (LIPITOR) 20 MG tablet Take 20 mg by mouth daily.    [provider]  predniSONE (DELTASONE) 20 MG tablet Two daily with food 10/31/14   [name], MD  raloxifene (EVISTA) 60 MG tablet Take 60 mg by mouth daily.    [provider]    Family History Family History  Problem Relation Age of Onset  . Cancer Mother   . Diabetes Father     Social History Social History   Tobacco Use  . Smoking status: Never Smoker  . Smokeless tobacco: Never Used  Substance Use Topics  . Alcohol use: Never    Frequency: Never  . Drug use: Never     Allergies   Patient has no known allergies.   Review of Systems Review of Systems  Musculoskeletal:       See HPI.  Skin: Positive for wound.  Neurological: Negative for numbness.     Physical Exam Updated  Vital Signs BP (!) 169/91 (BP Location: Right Arm)   Pulse (!) 106   Temp 98.3 F (36.8 C) (Oral)   Resp 15   SpO2 99%   Physical Exam Constitutional:      Appearance: She is well-developed.  Neck:     Musculoskeletal: Normal range of motion.  Pulmonary:     Effort: Pulmonary effort is normal.  Skin:    General: Skin is warm and dry.     Comments: 2.5 cm linear laceration at medial base of right thumb. No exposed tendons. No FB visualized or palpated. FROM without tendon deficit.   Neurological:     Mental Status: She is alert and oriented to person, place, and time.     Sensory: No sensory deficit.      ED Treatments / Results  Labs (all labs ordered are listed, but only abnormal results are displayed) Labs Reviewed - No data to display  EKG None  Radiology No results found.  Procedures .Marland KitchenLaceration Repair  Date/Time: 03/07/2019 10:29 PM Performed by: Charlann Lange, PA-C Authorized by: Charlann Lange, PA-C   Consent:    Consent obtained:  Verbal   Consent given by:  Patient Anesthesia (see MAR for exact dosages):    Anesthesia method:  Local infiltration   Local anesthetic:  Lidocaine 1% w/o epi Laceration details:  Location:  Finger   Finger location:  R thumb   Length (cm):  2.5 Pre-procedure details:    Preparation:  Patient was prepped and draped in usual sterile fashion Exploration:    Hemostasis achieved with:  Direct pressure   Wound exploration: wound explored through full range of motion and entire depth of wound probed and visualized     Wound extent: no foreign bodies/material noted   Treatment:    Area cleansed with:  Betadine and saline Skin repair:    Repair method:  Sutures   Suture size:  5-0   Suture material:  Prolene   Suture technique:  Simple interrupted   Number of sutures:  4 Approximation:    Approximation:  Close Post-procedure details:    Dressing:  Antibiotic ointment and non-adherent dressing   Patient tolerance of  procedure:  Tolerated well, no immediate complications   (including critical care time)  Medications Ordered in ED Medications  lidocaine (PF) (XYLOCAINE) 1 % injection 5 mL (has no administration in time range)     Initial Impression / Assessment and Plan / ED Course  I have reviewed the triage vital signs and the nursing notes.  Pertinent labs & imaging results that were available during my care of the patient were reviewed by me and considered in my medical decision making (see chart for details).        Patient to ED with uncomplicated right thumb laceration, repaired as per above note.   Final Clinical Impressions(s) / ED Diagnoses   Final diagnoses:  None   1. Right thumb laceration  ED Discharge Orders    None       Dennie Bible 03/07/19 2251    Charlesetta Shanks, MD 03/08/19 Bosie Helper

## 2019-03-07 NOTE — ED Triage Notes (Signed)
Patient presents wit skin laceration at right palm sustained from a broken drinking glass while washing dishes this evening , minimal bleeding , japanese interpreter service utilized at triage .

## 2019-03-07 NOTE — ED Notes (Signed)
Patient verbalizes understanding of discharge instructions. Opportunity for questioning and answers were provided. Armband removed by staff, pt discharged from ED ambulatory.   

## 2019-03-07 NOTE — Discharge Instructions (Addendum)
Sutures need to be removed in 7 days with your primary medical provider, any Urgent Care or the Emergency Department. Go sooner for re-examination if there are any signs of infection - increasing pain or redness, drainage from the wound or fever. Tylenol as needed for discomfort.

## 2019-03-14 DIAGNOSIS — S61219A Laceration without foreign body of unspecified finger without damage to nail, initial encounter: Secondary | ICD-10-CM | POA: Diagnosis not present

## 2019-04-27 ENCOUNTER — Ambulatory Visit: Payer: Medicare Other | Attending: Internal Medicine

## 2019-04-27 DIAGNOSIS — Z23 Encounter for immunization: Secondary | ICD-10-CM | POA: Diagnosis not present

## 2019-04-27 NOTE — Progress Notes (Signed)
   Covid-19 Vaccination Clinic  Name:  Raeshawn Demetrius    MRN: [id number] DOB: 12-14-1940  04/27/2019  Ms. Letson was observed post Covid-19 immunization for 15 minutes without incidence. She was provided with Vaccine Information Sheet and instruction to access the V-Safe system.   Ms. Feilen was instructed to call 911 with any severe reactions post vaccine: Marland Kitchen Difficulty breathing  . Swelling of your face and throat  . A fast heartbeat  . A bad rash all over your body  . Dizziness and weakness    Immunizations Administered    Name Date Dose VIS Date Route   Pfizer COVID-19 Vaccine 04/27/2019  1:26 PM 0.3 mL 03/22/2019 Intramuscular   Manufacturer: [place]   Lot: [id number]   [place]: [id number]

## 2019-05-17 ENCOUNTER — Ambulatory Visit: Payer: Medicare Other | Attending: Internal Medicine

## 2019-05-17 DIAGNOSIS — Z23 Encounter for immunization: Secondary | ICD-10-CM | POA: Insufficient documentation

## 2019-05-17 NOTE — Progress Notes (Signed)
   Covid-19 Vaccination Clinic  Name:  Prescious Sucher    MRN: [id number] DOB: 21-Mar-1941  05/17/2019  Ms. Grierson was observed post Covid-19 immunization for 15 minutes without incidence. She was provided with Vaccine Information Sheet and instruction to access the V-Safe system.   Ms. Purington was instructed to call 911 with any severe reactions post vaccine: Marland Kitchen Difficulty breathing  . Swelling of your face and throat  . A fast heartbeat  . A bad rash all over your body  . Dizziness and weakness    Immunizations Administered    Name Date Dose VIS Date Route   Pfizer COVID-19 Vaccine 05/17/2019  8:52 AM 0.3 mL 03/22/2019 Intramuscular   Manufacturer: [place]   Lot: [id number]   [place]: [id number]

## 2019-08-28 DIAGNOSIS — H43813 Vitreous degeneration, bilateral: Secondary | ICD-10-CM | POA: Diagnosis not present

## 2019-08-28 DIAGNOSIS — H40013 Open angle with borderline findings, low risk, bilateral: Secondary | ICD-10-CM | POA: Diagnosis not present

## 2019-12-10 DIAGNOSIS — M19041 Primary osteoarthritis, right hand: Secondary | ICD-10-CM | POA: Diagnosis not present

## 2019-12-10 DIAGNOSIS — M189 Osteoarthritis of first carpometacarpal joint, unspecified: Secondary | ICD-10-CM | POA: Diagnosis not present

## 2019-12-10 DIAGNOSIS — G2581 Restless legs syndrome: Secondary | ICD-10-CM | POA: Diagnosis not present

## 2019-12-10 DIAGNOSIS — Z79899 Other long term (current) drug therapy: Secondary | ICD-10-CM | POA: Diagnosis not present

## 2019-12-10 DIAGNOSIS — E785 Hyperlipidemia, unspecified: Secondary | ICD-10-CM | POA: Diagnosis not present

## 2019-12-10 DIAGNOSIS — G4700 Insomnia, unspecified: Secondary | ICD-10-CM | POA: Diagnosis not present

## 2019-12-10 DIAGNOSIS — Z5181 Encounter for therapeutic drug level monitoring: Secondary | ICD-10-CM | POA: Diagnosis not present

## 2019-12-10 DIAGNOSIS — E559 Vitamin D deficiency, unspecified: Secondary | ICD-10-CM | POA: Diagnosis not present

## 2020-01-05 DIAGNOSIS — Z23 Encounter for immunization: Secondary | ICD-10-CM | POA: Diagnosis not present

## 2020-08-13 DIAGNOSIS — K529 Noninfective gastroenteritis and colitis, unspecified: Secondary | ICD-10-CM | POA: Diagnosis not present

## 2020-08-31 DIAGNOSIS — H2513 Age-related nuclear cataract, bilateral: Secondary | ICD-10-CM | POA: Diagnosis not present

## 2020-08-31 DIAGNOSIS — H40013 Open angle with borderline findings, low risk, bilateral: Secondary | ICD-10-CM | POA: Diagnosis not present

## 2020-08-31 DIAGNOSIS — H43813 Vitreous degeneration, bilateral: Secondary | ICD-10-CM | POA: Diagnosis not present

## 2020-08-31 DIAGNOSIS — H25043 Posterior subcapsular polar age-related cataract, bilateral: Secondary | ICD-10-CM | POA: Diagnosis not present

## 2020-12-16 DIAGNOSIS — H6120 Impacted cerumen, unspecified ear: Secondary | ICD-10-CM | POA: Diagnosis not present

## 2020-12-16 DIAGNOSIS — Z5181 Encounter for therapeutic drug level monitoring: Secondary | ICD-10-CM | POA: Diagnosis not present

## 2020-12-16 DIAGNOSIS — G2581 Restless legs syndrome: Secondary | ICD-10-CM | POA: Diagnosis not present

## 2020-12-16 DIAGNOSIS — E785 Hyperlipidemia, unspecified: Secondary | ICD-10-CM | POA: Diagnosis not present

## 2020-12-16 DIAGNOSIS — M189 Osteoarthritis of first carpometacarpal joint, unspecified: Secondary | ICD-10-CM | POA: Diagnosis not present

## 2020-12-16 DIAGNOSIS — L820 Inflamed seborrheic keratosis: Secondary | ICD-10-CM | POA: Diagnosis not present

## 2020-12-16 DIAGNOSIS — M8580 Other specified disorders of bone density and structure, unspecified site: Secondary | ICD-10-CM | POA: Diagnosis not present

## 2020-12-16 DIAGNOSIS — G4700 Insomnia, unspecified: Secondary | ICD-10-CM | POA: Diagnosis not present

## 2020-12-16 DIAGNOSIS — Z79899 Other long term (current) drug therapy: Secondary | ICD-10-CM | POA: Diagnosis not present

## 2020-12-16 DIAGNOSIS — E559 Vitamin D deficiency, unspecified: Secondary | ICD-10-CM | POA: Diagnosis not present

## 2020-12-16 DIAGNOSIS — M19041 Primary osteoarthritis, right hand: Secondary | ICD-10-CM | POA: Diagnosis not present

## 2020-12-16 DIAGNOSIS — Z0000 Encounter for general adult medical examination without abnormal findings: Secondary | ICD-10-CM | POA: Diagnosis not present

## 2021-01-05 DIAGNOSIS — Z23 Encounter for immunization: Secondary | ICD-10-CM | POA: Diagnosis not present

## 2021-03-02 DIAGNOSIS — Z780 Asymptomatic menopausal state: Secondary | ICD-10-CM | POA: Diagnosis not present

## 2021-03-02 DIAGNOSIS — M8589 Other specified disorders of bone density and structure, multiple sites: Secondary | ICD-10-CM | POA: Diagnosis not present

## 2021-03-02 DIAGNOSIS — Z1231 Encounter for screening mammogram for malignant neoplasm of breast: Secondary | ICD-10-CM | POA: Diagnosis not present

## 2021-05-03 DIAGNOSIS — Z23 Encounter for immunization: Secondary | ICD-10-CM | POA: Diagnosis not present

## 2021-09-14 DIAGNOSIS — H43813 Vitreous degeneration, bilateral: Secondary | ICD-10-CM | POA: Diagnosis not present

## 2021-09-14 DIAGNOSIS — H40013 Open angle with borderline findings, low risk, bilateral: Secondary | ICD-10-CM | POA: Diagnosis not present

## 2021-09-14 DIAGNOSIS — H25043 Posterior subcapsular polar age-related cataract, bilateral: Secondary | ICD-10-CM | POA: Diagnosis not present

## 2021-09-14 DIAGNOSIS — H2513 Age-related nuclear cataract, bilateral: Secondary | ICD-10-CM | POA: Diagnosis not present

## 2021-12-27 DIAGNOSIS — G2581 Restless legs syndrome: Secondary | ICD-10-CM | POA: Diagnosis not present

## 2021-12-27 DIAGNOSIS — M19041 Primary osteoarthritis, right hand: Secondary | ICD-10-CM | POA: Diagnosis not present

## 2021-12-27 DIAGNOSIS — M8580 Other specified disorders of bone density and structure, unspecified site: Secondary | ICD-10-CM | POA: Diagnosis not present

## 2021-12-27 DIAGNOSIS — M25569 Pain in unspecified knee: Secondary | ICD-10-CM | POA: Diagnosis not present

## 2021-12-27 DIAGNOSIS — Z5181 Encounter for therapeutic drug level monitoring: Secondary | ICD-10-CM | POA: Diagnosis not present

## 2021-12-27 DIAGNOSIS — E785 Hyperlipidemia, unspecified: Secondary | ICD-10-CM | POA: Diagnosis not present

## 2021-12-27 DIAGNOSIS — Z0000 Encounter for general adult medical examination without abnormal findings: Secondary | ICD-10-CM | POA: Diagnosis not present

## 2021-12-27 DIAGNOSIS — G4700 Insomnia, unspecified: Secondary | ICD-10-CM | POA: Diagnosis not present

## 2021-12-27 DIAGNOSIS — M189 Osteoarthritis of first carpometacarpal joint, unspecified: Secondary | ICD-10-CM | POA: Diagnosis not present

## 2021-12-27 DIAGNOSIS — E559 Vitamin D deficiency, unspecified: Secondary | ICD-10-CM | POA: Diagnosis not present

## 2021-12-27 DIAGNOSIS — Z23 Encounter for immunization: Secondary | ICD-10-CM | POA: Diagnosis not present

## 2021-12-27 DIAGNOSIS — Z79899 Other long term (current) drug therapy: Secondary | ICD-10-CM | POA: Diagnosis not present

## 2022-02-11 DIAGNOSIS — Z23 Encounter for immunization: Secondary | ICD-10-CM | POA: Diagnosis not present

## 2022-03-08 DIAGNOSIS — Z1231 Encounter for screening mammogram for malignant neoplasm of breast: Secondary | ICD-10-CM | POA: Diagnosis not present

## 2022-09-20 DIAGNOSIS — H2513 Age-related nuclear cataract, bilateral: Secondary | ICD-10-CM | POA: Diagnosis not present

## 2022-09-20 DIAGNOSIS — H40013 Open angle with borderline findings, low risk, bilateral: Secondary | ICD-10-CM | POA: Diagnosis not present

## 2022-09-20 DIAGNOSIS — H43813 Vitreous degeneration, bilateral: Secondary | ICD-10-CM | POA: Diagnosis not present

## 2022-09-20 DIAGNOSIS — H25043 Posterior subcapsular polar age-related cataract, bilateral: Secondary | ICD-10-CM | POA: Diagnosis not present

## 2022-10-02 DIAGNOSIS — Z681 Body mass index (BMI) 19 or less, adult: Secondary | ICD-10-CM | POA: Diagnosis not present

## 2022-10-02 DIAGNOSIS — R030 Elevated blood-pressure reading, without diagnosis of hypertension: Secondary | ICD-10-CM | POA: Diagnosis not present

## 2022-10-02 DIAGNOSIS — L247 Irritant contact dermatitis due to plants, except food: Secondary | ICD-10-CM | POA: Diagnosis not present

## 2023-01-18 DIAGNOSIS — M25569 Pain in unspecified knee: Secondary | ICD-10-CM | POA: Diagnosis not present

## 2023-01-18 DIAGNOSIS — E559 Vitamin D deficiency, unspecified: Secondary | ICD-10-CM | POA: Diagnosis not present

## 2023-01-18 DIAGNOSIS — R739 Hyperglycemia, unspecified: Secondary | ICD-10-CM | POA: Diagnosis not present

## 2023-01-18 DIAGNOSIS — M8580 Other specified disorders of bone density and structure, unspecified site: Secondary | ICD-10-CM | POA: Diagnosis not present

## 2023-01-18 DIAGNOSIS — G4700 Insomnia, unspecified: Secondary | ICD-10-CM | POA: Diagnosis not present

## 2023-01-18 DIAGNOSIS — G2581 Restless legs syndrome: Secondary | ICD-10-CM | POA: Diagnosis not present

## 2023-01-18 DIAGNOSIS — Z0000 Encounter for general adult medical examination without abnormal findings: Secondary | ICD-10-CM | POA: Diagnosis not present

## 2023-01-18 DIAGNOSIS — Z23 Encounter for immunization: Secondary | ICD-10-CM | POA: Diagnosis not present

## 2023-01-18 DIAGNOSIS — M19041 Primary osteoarthritis, right hand: Secondary | ICD-10-CM | POA: Diagnosis not present

## 2023-01-18 DIAGNOSIS — E785 Hyperlipidemia, unspecified: Secondary | ICD-10-CM | POA: Diagnosis not present

## 2023-01-18 DIAGNOSIS — Z5181 Encounter for therapeutic drug level monitoring: Secondary | ICD-10-CM | POA: Diagnosis not present

## 2023-01-18 DIAGNOSIS — Z79899 Other long term (current) drug therapy: Secondary | ICD-10-CM | POA: Diagnosis not present

## 2023-01-18 DIAGNOSIS — M189 Osteoarthritis of first carpometacarpal joint, unspecified: Secondary | ICD-10-CM | POA: Diagnosis not present

## 2023-01-19 ENCOUNTER — Other Ambulatory Visit: Payer: Self-pay | Admitting: Internal Medicine

## 2023-01-19 DIAGNOSIS — M8580 Other specified disorders of bone density and structure, unspecified site: Secondary | ICD-10-CM

## 2023-03-14 DIAGNOSIS — Z1231 Encounter for screening mammogram for malignant neoplasm of breast: Secondary | ICD-10-CM | POA: Diagnosis not present

## 2023-10-16 DIAGNOSIS — M8588 Other specified disorders of bone density and structure, other site: Secondary | ICD-10-CM | POA: Diagnosis not present

## 2023-12-26 DIAGNOSIS — H2513 Age-related nuclear cataract, bilateral: Secondary | ICD-10-CM | POA: Diagnosis not present

## 2023-12-26 DIAGNOSIS — H40013 Open angle with borderline findings, low risk, bilateral: Secondary | ICD-10-CM | POA: Diagnosis not present

## 2023-12-26 DIAGNOSIS — H04123 Dry eye syndrome of bilateral lacrimal glands: Secondary | ICD-10-CM | POA: Diagnosis not present

## 2023-12-26 DIAGNOSIS — H43813 Vitreous degeneration, bilateral: Secondary | ICD-10-CM | POA: Diagnosis not present

## 2024-01-19 DIAGNOSIS — M189 Osteoarthritis of first carpometacarpal joint, unspecified: Secondary | ICD-10-CM | POA: Diagnosis not present

## 2024-01-19 DIAGNOSIS — Z0000 Encounter for general adult medical examination without abnormal findings: Secondary | ICD-10-CM | POA: Diagnosis not present

## 2024-01-19 DIAGNOSIS — M810 Age-related osteoporosis without current pathological fracture: Secondary | ICD-10-CM | POA: Diagnosis not present

## 2024-01-19 DIAGNOSIS — E785 Hyperlipidemia, unspecified: Secondary | ICD-10-CM | POA: Diagnosis not present

## 2024-01-19 DIAGNOSIS — G4700 Insomnia, unspecified: Secondary | ICD-10-CM | POA: Diagnosis not present

## 2024-01-19 DIAGNOSIS — Z5181 Encounter for therapeutic drug level monitoring: Secondary | ICD-10-CM | POA: Diagnosis not present

## 2024-01-19 DIAGNOSIS — Z23 Encounter for immunization: Secondary | ICD-10-CM | POA: Diagnosis not present

## 2024-01-19 DIAGNOSIS — R7303 Prediabetes: Secondary | ICD-10-CM | POA: Diagnosis not present

## 2024-01-19 DIAGNOSIS — M25569 Pain in unspecified knee: Secondary | ICD-10-CM | POA: Diagnosis not present

## 2024-01-19 DIAGNOSIS — Z79899 Other long term (current) drug therapy: Secondary | ICD-10-CM | POA: Diagnosis not present

## 2024-01-19 DIAGNOSIS — M19041 Primary osteoarthritis, right hand: Secondary | ICD-10-CM | POA: Diagnosis not present

## 2024-01-19 DIAGNOSIS — G2581 Restless legs syndrome: Secondary | ICD-10-CM | POA: Diagnosis not present

## 2024-03-18 DIAGNOSIS — K921 Melena: Secondary | ICD-10-CM | POA: Diagnosis not present

## 2024-03-18 DIAGNOSIS — Z86018 Personal history of other benign neoplasm: Secondary | ICD-10-CM | POA: Diagnosis not present

## 2024-03-19 DIAGNOSIS — Z1231 Encounter for screening mammogram for malignant neoplasm of breast: Secondary | ICD-10-CM | POA: Diagnosis not present
